# Patient Record
Sex: Female | Born: 1980 | Race: Black or African American | Hispanic: No | Marital: Single | Smoking: Never smoker
Health system: Southern US, Community
[De-identification: ages and names within clinical notes are randomized; demographics above are authoritative.]

## PROBLEM LIST (undated history)

## (undated) DIAGNOSIS — J45909 Unspecified asthma, uncomplicated: Secondary | ICD-10-CM

## (undated) DIAGNOSIS — J302 Other seasonal allergic rhinitis: Secondary | ICD-10-CM

## (undated) DIAGNOSIS — I509 Heart failure, unspecified: Secondary | ICD-10-CM

## (undated) DIAGNOSIS — D649 Anemia, unspecified: Secondary | ICD-10-CM

## (undated) DIAGNOSIS — I10 Essential (primary) hypertension: Secondary | ICD-10-CM

## (undated) HISTORY — PX: ADENOIDECTOMY: SHX5191

## (undated) HISTORY — PX: TONSILLECTOMY: SUR1361

---

## 2014-11-10 ENCOUNTER — Encounter (HOSPITAL_BASED_OUTPATIENT_CLINIC_OR_DEPARTMENT_OTHER): Payer: Self-pay | Admitting: *Deleted

## 2014-11-10 ENCOUNTER — Emergency Department (HOSPITAL_BASED_OUTPATIENT_CLINIC_OR_DEPARTMENT_OTHER): Payer: No Typology Code available for payment source

## 2014-11-10 ENCOUNTER — Emergency Department (HOSPITAL_BASED_OUTPATIENT_CLINIC_OR_DEPARTMENT_OTHER)
Admission: EM | Admit: 2014-11-10 | Discharge: 2014-11-10 | Disposition: A | Discharge status: Home / Self Care | Payer: No Typology Code available for payment source | Attending: Emergency Medicine | Admitting: Emergency Medicine

## 2014-11-10 DIAGNOSIS — Z862 Personal history of diseases of the blood and blood-forming organs and certain disorders involving the immune mechanism: Secondary | ICD-10-CM | POA: Insufficient documentation

## 2014-11-10 DIAGNOSIS — I509 Heart failure, unspecified: Secondary | ICD-10-CM | POA: Insufficient documentation

## 2014-11-10 DIAGNOSIS — Y9389 Activity, other specified: Secondary | ICD-10-CM | POA: Insufficient documentation

## 2014-11-10 DIAGNOSIS — I10 Essential (primary) hypertension: Secondary | ICD-10-CM | POA: Insufficient documentation

## 2014-11-10 DIAGNOSIS — S8001XA Contusion of right knee, initial encounter: Secondary | ICD-10-CM

## 2014-11-10 DIAGNOSIS — Y998 Other external cause status: Secondary | ICD-10-CM | POA: Insufficient documentation

## 2014-11-10 DIAGNOSIS — J45909 Unspecified asthma, uncomplicated: Secondary | ICD-10-CM | POA: Insufficient documentation

## 2014-11-10 DIAGNOSIS — Z79899 Other long term (current) drug therapy: Secondary | ICD-10-CM | POA: Insufficient documentation

## 2014-11-10 DIAGNOSIS — Y92410 Unspecified street and highway as the place of occurrence of the external cause: Secondary | ICD-10-CM | POA: Insufficient documentation

## 2014-11-10 HISTORY — DX: Heart failure, unspecified: I50.9

## 2014-11-10 HISTORY — DX: Unspecified asthma, uncomplicated: J45.909

## 2014-11-10 HISTORY — DX: Morbid (severe) obesity due to excess calories: E66.01

## 2014-11-10 HISTORY — DX: Other seasonal allergic rhinitis: J30.2

## 2014-11-10 HISTORY — DX: Essential (primary) hypertension: I10

## 2014-11-10 HISTORY — DX: Anemia, unspecified: D64.9

## 2014-11-10 MED ORDER — TRAMADOL HCL 50 MG PO TABS: 50.0000 mg | ORAL_TABLET | Freq: Four times a day (QID) | ORAL | Status: AC | PRN

## 2014-11-10 NOTE — ED Notes (Signed)
Family member to bedside
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
## 2014-11-10 NOTE — Discharge Instructions (Signed)
Contusion °A contusion is a deep bruise. Contusions are the result of an injury that caused bleeding under the skin. The contusion may turn blue, purple, or yellow. Minor injuries will give you a painless contusion, but more severe contusions may stay painful and swollen for a few weeks.  °CAUSES  °A contusion is usually caused by a blow, trauma, or direct force to an area of the body. °SYMPTOMS  °· Swelling and redness of the injured area. °· Bruising of the injured area. °· Tenderness and soreness of the injured area. °· Pain. °DIAGNOSIS  °The diagnosis can be made by taking a history and physical exam. An X-ray, CT scan, or MRI may be needed to determine if there were any associated injuries, such as fractures. °TREATMENT  °Specific treatment will depend on what area of the body was injured. In general, the best treatment for a contusion is resting, icing, elevating, and applying cold compresses to the injured area. Over-the-counter medicines may also be recommended for pain control. Ask your caregiver what the best treatment is for your contusion. °HOME CARE INSTRUCTIONS  °· Put ice on the injured area. °· Put ice in a plastic bag. °· Place a towel between your skin and the bag. °· Leave the ice on for 15-20 minutes, 3-4 times a day, or as directed by your health care provider. °· Only take over-the-counter or prescription medicines for pain, discomfort, or fever as directed by your caregiver. Your caregiver may recommend avoiding anti-inflammatory medicines (aspirin, ibuprofen, and naproxen) for 48 hours because these medicines may increase bruising. °· Rest the injured area. °· If possible, elevate the injured area to reduce swelling. °SEEK IMMEDIATE MEDICAL CARE IF:  °· You have increased bruising or swelling. °· You have pain that is getting worse. °· Your swelling or pain is not relieved with medicines. °MAKE SURE YOU:  °· Understand these instructions. °· Will watch your condition. °· Will get help right  away if you are not doing well or get worse. °Document Released: 08/28/2005 Document Revised: 11/23/2013 Document Reviewed: 09/23/2011 °ExitCare® Patient Information ©2015 [employer]. This information is not intended to replace advice given to you by your health care provider. Make sure you discuss any questions you have with your health care provider. °Motor Vehicle Collision °It is common to have multiple bruises and sore muscles after a motor vehicle collision (MVC). These tend to feel worse for the first 24 hours. You may have the most stiffness and soreness over the first several hours. You may also feel worse when you wake up the first morning after your collision. After this point, you will usually begin to improve with each day. The speed of improvement often depends on the severity of the collision, the number of injuries, and the location and nature of these injuries. °HOME CARE INSTRUCTIONS °· Put ice on the injured area. °¨ Put ice in a plastic bag. °¨ Place a towel between your skin and the bag. °¨ Leave the ice on for 15-20 minutes, 3-4 times a day, or as directed by your health care provider. °· Drink enough fluids to keep your urine clear or pale yellow. Do not drink alcohol. °· Take a warm shower or bath once or twice a day. This will increase blood flow to sore muscles. °· You may return to activities as directed by your caregiver. Be careful when lifting, as this may aggravate neck or back pain. °· Only take over-the-counter or prescription medicines for pain, discomfort, or fever as   directed by your caregiver. Do not use aspirin. This may increase bruising and bleeding. °SEEK IMMEDIATE MEDICAL CARE IF: °· You have numbness, tingling, or weakness in the arms or legs. °· You develop severe headaches not relieved with medicine. °· You have severe neck pain, especially tenderness in the middle of the back of your neck. °· You have changes in bowel or bladder control. °· There is increasing pain in  any area of the body. °· You have shortness of breath, light-headedness, dizziness, or fainting. °· You have chest pain. °· You feel sick to your stomach (nauseous), throw up (vomit), or sweat. °· You have increasing abdominal discomfort. °· There is blood in your urine, stool, or vomit. °· You have pain in your shoulder (shoulder strap areas). °· You feel your symptoms are getting worse. °MAKE SURE YOU: °· Understand these instructions. °· Will watch your condition. °· Will get help right away if you are not doing well or get worse. °Document Released: 11/18/2005 Document Revised: 04/04/2014 Document Reviewed: 04/17/2011 °ExitCare® Patient Information ©2015 [employer]. This information is not intended to replace advice given to you by your health care provider. Make sure you discuss any questions you have with your health care provider. ° °Emergency Department Resource Guide °1) Find a Doctor and Pay Out of Pocket °Although you won't have to find out who is covered by your insurance plan, it is a good idea to ask around and get recommendations. You will then need to call the office and see if the doctor you have chosen will accept you as a new patient and what types of options they offer for patients who are self-pay. Some doctors offer discounts or will set up payment plans for their patients who do not have insurance, but you will need to ask so you aren't surprised when you get to your appointment. ° °2) Contact Your Local Health Department °Not all health departments have doctors that can see patients for sick visits, but many do, so it is worth a call to see if yours does. If you don't know where your local health department is, you can check in your phone book. The CDC also has a tool to help you locate your state's health department, and many state websites also have listings of all of their local health departments. ° °3) Find a Walk-in Clinic °If your illness is not likely to be very severe or  complicated, you may want to try a walk in clinic. These are popping up all over the country in pharmacies, drugstores, and shopping centers. They're usually staffed by nurse practitioners or physician assistants that have been trained to treat common illnesses and complaints. They're usually fairly quick and inexpensive. However, if you have serious medical issues or chronic medical problems, these are probably not your best option. ° °No Primary Care Doctor: °- Call [facility] at  832-8000 - they can help you locate a primary care doctor that  accepts your insurance, provides certain services, etc. °- Physician Referral Service- 1-800-533-3463 ° °Chronic Pain Problems: °Organization         Address  Phone   Notes  °[facility]  (336) 297-2271 Patients need to be referred by their primary care doctor.  ° °Medication Assistance: °Organization         Address  Phone   Notes  °[place] [facility] [address] °[place], [place] [address] (336) 641-8030 --Must be a resident of [place] °--   Must have NO insurance coverage whatsoever (no Medicaid/ Medicare, etc.) °-- The pt. MUST have a primary care doctor that directs their care regularly and follows them in the community °  °[facility]  (866) 331-1348   °[employer]  (888) 892-1162   ° °Agencies that provide inexpensive medical care: °Organization         Address  Phone   Notes  °[facility]  (336) 832-8035   °[facility]    (336) 832-7272   °[facility] [address] °[place], [place] [address] (336) 832-4777   °[facility] [address], °[place] (336) 271-4999   °[facility]    (336) 373-0678   °[facility]    (336) 272-1050   °[facility] ° [address], [place] Phone:  (336) 832-4444, Fax:  (336) 832-4440 Hours of Operation:  9 am - 6 pm, M-F.  Also accepts  Medicaid/Medicare and self-pay.  °[facility] ° [address], [place] Phone: (336) 832-3150, Fax: (336) [phone]. Hours of Operation:  8:30 am - 5:30 pm, M-F.  Also accepts Medicaid and self-pay.  °[facility] [place] [address], [place] Phone: (336) 878-6027   °[facility] [address], [place], [place] (336)723-1848, Ext. 123 Mondays & Thursdays: 7-9 AM.  First 15 patients are seen on a first come, first serve basis. °  ° °Medicaid-accepting [place] Providers: ° °Organization         Address  Phone   Notes  °[facility] [address], [place] (336) 641-2100 Also accepts self-pay patients.  °[facility] [address], [place] ° (336) 856-9996   °[facility] [address], [place] (336) 288-8857   °[facility] [address] [place] [address], [place] (336) 299-7000   °Veita Bland [address], [place]  ° (336) 373-1557 Only accepts [place] Access Medicaid patients after they have their name applied to their card.  ° °Self-Pay (no insurance) in [place]: ° °Organization         Address  Phone   Notes  °Sickle Cell Patients, [facility] [address], [place] (336) 832-1970   °[facility] [address], [place] (336) 832-4400   °[facility] ° [address], [place] (336) 992-4800   °[facility]/Dr. Osei-Bonsu ° [address] [place] [address], [place] or [address], [place] (336) [phone] Phone number for both [place] and [place] locations is the same.  °[facility] [address], [place] (336) 299-0000   °[facility] [address] [place] [address], [place] or [address] (336) 852-7530 °(336) 878-2260   °[facility] [address],  (336)  350-1642, phone; (336) 294-5005, fax Sees patients 1st and 3rd Saturday of every month.  Must not qualify for public or private insurance (i.e. Medicaid, Medicare,  Health Choice, Veterans' Benefits) • Household income should be no more than 200% of the poverty level •The clinic cannot treat you if you are pregnant or think you are pregnant • Sexually transmitted diseases are not treated at the clinic.  ° ° °Dental Care: °Organization         Address  Phone  Notes  °  [place] [facility] [address], [place] (336) 641-6152 Accepts children up to age 21 who are enrolled in Medicaid or [place] Health Choice; pregnant women with a Medicaid card; and children who have applied for Medicaid or [place] Health Choice, but were declined, whose parents can pay a reduced fee at time of service.  °[facility] [place]  [address], [place] (336) 641-7733 Accepts children up to age 21 who are enrolled in Medicaid or [place] Health Choice; pregnant women with a Medicaid card; and children who have applied for Medicaid or [place] Health Choice, but were declined, whose parents can pay a reduced fee at time of service.  °[facility] ° [address], [place] (336) 641-4533 Patients are seen by appointment only. Walk-ins are not accepted. [facility] will see patients 18 years of age and older. °Monday - Tuesday (8am-5pm) °Most Wednesdays (8:30-5pm) °$30 per visit, cash only  °[facility] ° [address], [place] (336) 641-4533 Patients are seen by appointment only. Walk-ins are not accepted. [facility] will see patients 18 years of age and older. °One Wednesday Evening (Monthly: Volunteer Based).  $30 per visit, cash only  °[facility]  (919) 537-3737 for adults; Children under age 4, call Graduate Pediatric Dentistry at ([phone]) [phone]. Children aged  4-14, please call (919) [phone] to request a pediatric application. ° Dental services are provided in all areas of dental care including fillings, crowns and bridges, complete and partial dentures, implants, gum treatment, root canals, and extractions. Preventive care is also provided. Treatment is provided to both adults and children. °Patients are selected via a lottery and there is often a waiting list. °  °[facility] [address], °[place] ° (336) 763-8833 [email or web address] °  °[facility] [address], [place], [place] (336)723-1848, Ext. 123 Second and Fourth Thursday of each month, opens at 6:30 AM; Clinic ends at 9 AM.  Patients are seen on a first-come first-served basis, and a limited number are seen during each clinic.  ° °[facility] ° [address], [place], [place] (336) 723-7904   Eligibility Requirements °You must have lived in [place], [place], or [place] counties for at least the last three months. °  You cannot be eligible for state or federal sponsored healthcare insurance, including Veterans Administration, Medicaid, or Medicare. °  You generally cannot be eligible for healthcare insurance through your employer.  °  How to apply: °Eligibility screenings are held every Tuesday and Wednesday afternoon from 1:00 pm until 4:00 pm. You do not need an appointment for the interview!  °[facility] [address], [place], [place] 336-631-2330   °[facility]  336-342-8273   °[facility]  336-703-3100   °[facility]  336-570-6415   ° °Behavioral Health Resources in the Community: °Intensive Outpatient Programs °Organization         Address  Phone  Notes  °[facility] [address], [place], [place] 336-878-6098   °[facility] [address], [place], [place] 336-832-9800   °[facility] [address],  [place], [place] ° 336-882-2125   °[facility] [address],  °[place], [place] 1-800-853-5163 or 336-641-4981   °Substance Abuse Resources °Organization           Address  Phone  Notes  °[facility]  336-882-2125   °[facility]  336-784-9470   °[facility]  336-285-9073   °[facility]  336-845-3988   °Residential & Outpatient Substance Abuse Program  1-800-659-3381   °Psychological Services °Organization         Address  Phone  Notes  °[facility]  336- 832-9600   °[facility]  336- 378-7881   °[facility] [address], [place] 1-800-853-5163 or 336-641-4981   ° °Mobile Crisis Teams °Organization         Address  Phone  Notes  °[facility], Mobile Crisis Care Unit  1-877-626-1772   °Assertive °[facility] ° [address] [place], [place] 336-834-9664   °Sharon DeEsch [address] °[place] 336-554-5454   ° °Self-Help/Support Groups °Organization         Address  Phone             Notes  °[facility] [place] - variety of support groups  336- 373-1402 Call for more information  °Narcotics Anonymous (NA), [facility] [address], °[place]  2 meetings at this location  ° °Residential Treatment Programs °Organization         Address  Phone  Notes  °[facility] [address],    °[place]  1-866-801-8205   °[facility] ° [address], [place], [place] 704-293-8524   °[facility] [address], [place] 336-845-3988 Admissions: 8am-3pm M-F  °[facility] [address]    °[place], [place] 336-841-1104   °[facility] [address], [place], [place] 336-379-7146   °[facility] [address]  °[place], [place] 336-285-9073   °[facility] Intensive Outpatient [address], [place], [place] 336-852-3033   °[facility]  ([facility] [address]  °[place], [place] 1-877-615-2722 or 336-784-9470   °[facility] [address] [place], [place] 336-227-7417 Accepts Medicaid  °[facility] [address]  °[place] 1-800-659-3381 Substance Abuse/Addiction Treatment  ° °[place] Behavioral Health Resources °Organization         Address  Phone  Notes  °[facility]  (888) 581-9988   °Julie Brannon, PhD [address] [place], [place]   (336) 349-5553 or (336) 951-0000   °[facility]   [address] °[place], [place] (336) 349-4454   °[facility] [address], [place], [place] (336) 342-8316 Insurance/Medicaid/sponsorship through [facility]  °[facility] [address]                                    [place], [place] (336) 342-8316 Therapy/tele-psych/case  °[facility] [address]  ° [place],  (336) 349-2233    °Dr. Arfeen  (336) 349-4544   °[facility] [place]  [employer] [place] [facility] 1) [address],  °2) [address], [place] °3)  [address], [place] (336) 349-3220 °(336) 342-7768 ° °(336) 342-8140   °[place] [facility] (336) 342-1394 or (336) 342-3537 (After Hours)    ° ° °

## 2014-11-10 NOTE — ED Notes (Signed)
Patient transported to X-ray via wheelchair per tech. 
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
## 2015-06-15 ENCOUNTER — Emergency Department (HOSPITAL_BASED_OUTPATIENT_CLINIC_OR_DEPARTMENT_OTHER)
Admission: EM | Admit: 2015-06-15 | Discharge: 2015-06-15 | Disposition: A | Discharge status: Home / Self Care | Payer: BLUE CROSS/BLUE SHIELD | Attending: Emergency Medicine | Admitting: Emergency Medicine

## 2015-06-15 ENCOUNTER — Emergency Department (HOSPITAL_BASED_OUTPATIENT_CLINIC_OR_DEPARTMENT_OTHER): Payer: BLUE CROSS/BLUE SHIELD

## 2015-06-15 ENCOUNTER — Encounter (HOSPITAL_BASED_OUTPATIENT_CLINIC_OR_DEPARTMENT_OTHER): Payer: Self-pay | Admitting: *Deleted

## 2015-06-15 DIAGNOSIS — I509 Heart failure, unspecified: Secondary | ICD-10-CM | POA: Insufficient documentation

## 2015-06-15 DIAGNOSIS — M7989 Other specified soft tissue disorders: Secondary | ICD-10-CM | POA: Diagnosis not present

## 2015-06-15 DIAGNOSIS — Z79899 Other long term (current) drug therapy: Secondary | ICD-10-CM | POA: Diagnosis not present

## 2015-06-15 DIAGNOSIS — J45909 Unspecified asthma, uncomplicated: Secondary | ICD-10-CM | POA: Diagnosis not present

## 2015-06-15 DIAGNOSIS — Z862 Personal history of diseases of the blood and blood-forming organs and certain disorders involving the immune mechanism: Secondary | ICD-10-CM | POA: Diagnosis not present

## 2015-06-15 DIAGNOSIS — Z7951 Long term (current) use of inhaled steroids: Secondary | ICD-10-CM | POA: Insufficient documentation

## 2015-06-15 DIAGNOSIS — N390 Urinary tract infection, site not specified: Secondary | ICD-10-CM | POA: Insufficient documentation

## 2015-06-15 DIAGNOSIS — R109 Unspecified abdominal pain: Secondary | ICD-10-CM | POA: Diagnosis present

## 2015-06-15 DIAGNOSIS — I10 Essential (primary) hypertension: Secondary | ICD-10-CM | POA: Insufficient documentation

## 2015-06-15 LAB — URINALYSIS, ROUTINE W REFLEX MICROSCOPIC
BILIRUBIN URINE: NEGATIVE
Glucose, UA: NEGATIVE mg/dL
Hgb urine dipstick: NEGATIVE
KETONES UR: NEGATIVE mg/dL
Nitrite: NEGATIVE
Protein, ur: 30 mg/dL — AB
Specific Gravity, Urine: 1.016 (ref 1.005–1.030)
UROBILINOGEN UA: 1 mg/dL (ref 0.0–1.0)
pH: 6.5 (ref 5.0–8.0)

## 2015-06-15 LAB — CBC WITH DIFFERENTIAL/PLATELET
Basophils Absolute: 0 10*3/uL (ref 0.0–0.1)
Basophils Relative: 0 % (ref 0–1)
EOS ABS: 0.1 10*3/uL (ref 0.0–0.7)
Eosinophils Relative: 2 % (ref 0–5)
HEMATOCRIT: 37.7 % (ref 36.0–46.0)
Hemoglobin: 11.8 g/dL — ABNORMAL LOW (ref 12.0–15.0)
Lymphocytes Relative: 31 % (ref 12–46)
Lymphs Abs: 1.3 10*3/uL (ref 0.7–4.0)
MCH: 24.9 pg — AB (ref 26.0–34.0)
MCHC: 31.3 g/dL (ref 30.0–36.0)
MCV: 79.7 fL (ref 78.0–100.0)
MONO ABS: 0.4 10*3/uL (ref 0.1–1.0)
MONOS PCT: 9 % (ref 3–12)
NEUTROS PCT: 58 % (ref 43–77)
Neutro Abs: 2.4 10*3/uL (ref 1.7–7.7)
Platelets: 203 10*3/uL (ref 150–400)
RBC: 4.73 MIL/uL (ref 3.87–5.11)
RDW: 16.2 % — ABNORMAL HIGH (ref 11.5–15.5)
WBC: 4.3 10*3/uL (ref 4.0–10.5)

## 2015-06-15 LAB — BASIC METABOLIC PANEL
Anion gap: 9 (ref 5–15)
BUN: 16 mg/dL (ref 6–20)
CALCIUM: 9.2 mg/dL (ref 8.9–10.3)
CO2: 27 mmol/L (ref 22–32)
Chloride: 102 mmol/L (ref 101–111)
Creatinine, Ser: 1.23 mg/dL — ABNORMAL HIGH (ref 0.44–1.00)
GFR calc Af Amer: >60 mL/min (ref >60)
GFR, EST NON AFRICAN AMERICAN: 56 mL/min — AB (ref >60)
GLUCOSE: 93 mg/dL (ref 65–99)
POTASSIUM: 3.1 mmol/L — AB (ref 3.5–5.1)
Sodium: 138 mmol/L (ref 135–145)

## 2015-06-15 LAB — URINE MICROSCOPIC-ADD ON

## 2015-06-15 LAB — PREGNANCY, URINE: PREG TEST UR: NEGATIVE

## 2015-06-15 MED ORDER — CEPHALEXIN 500 MG PO CAPS: 500.0000 mg | ORAL_CAPSULE | Freq: Four times a day (QID) | ORAL | Status: AC

## 2015-06-15 MED ORDER — CEPHALEXIN 250 MG PO CAPS
500.0000 mg | ORAL_CAPSULE | Freq: Once | ORAL | Status: AC
  Administered 2015-06-15: 500 mg via ORAL
  Filled 2015-06-15: qty 2

## 2015-06-15 NOTE — ED Notes (Signed)
Pt reports increased frequency and urgency with urination, strong odor to urine, reports increased pain with lower back and when urinates

## 2015-06-15 NOTE — ED Provider Notes (Signed)
CSN: [id number]     Arrival date & time 06/15/15  1849 History  This chart was scribed for Vanetta Mulders, MD by Elon Spanner, ED Scribe. This patient was seen in room MH11/MH11 and the patient's care was started at 8:44 PM.   Chief Complaint  Patient presents with  . Abdominal Pain   The history is provided by the patient. No language interpreter was used.   HPI Comments: Holly Newman is a 34 y.o. female who presents to the Emergency Department complaining of constant LLQ abdominal pain onset 3 days ago.  The pain is present only with the sensation of urgency.  If she does not relieve herself, she becomes incontinent.  She also reports associated fever TMAX 101-102 today and non-bloddy diarrhea once 3 days ago.  The patient reports a history of similar pain diagnosed as a UTI.  There is no history of kidney stones.  She denies n/v.    Past Medical History  Diagnosis Date  . Hypertension   . CHF (congestive heart failure)   . Asthma   . Anemia   . Seasonal allergies   . Obesities, morbid    Past Surgical History  Procedure Laterality Date  . Tonsillectomy    . Adenoidectomy     No family history on file. History  Substance Use Topics  . Smoking status: Never Smoker   . Smokeless tobacco: Never Used  . Alcohol Use: No   OB History    No data available     Review of Systems  Constitutional: Positive for fever. Negative for chills.  HENT: Negative for rhinorrhea and sore throat.   Eyes: Negative for visual disturbance.  Respiratory: Negative for cough and shortness of breath.   Cardiovascular: Positive for leg swelling. Negative for chest pain.  Gastrointestinal: Positive for abdominal pain.  Genitourinary: Positive for dysuria and urgency.  Musculoskeletal: Positive for back pain. Negative for neck pain.  Skin: Negative for rash.  Neurological: Negative for headaches.  Hematological: Does not bruise/bleed easily.  Psychiatric/Behavioral: Negative for confusion.       Allergies  Review of patient's allergies indicates no known allergies.  Home Medications   Prior to Admission medications   Medication Sig Start Date End Date Taking? Authorizing Provider  albuterol (PROVENTIL HFA;VENTOLIN HFA) 108 (90 BASE) MCG/ACT inhaler Inhale into the lungs every 6 (six) hours as needed for wheezing or shortness of breath.    Historical Provider, MD  budesonide-formoterol (SYMBICORT) 160-4.5 MCG/ACT inhaler Inhale 2 puffs into the lungs 2 (two) times daily.    Historical Provider, MD  carvedilol (COREG) 12.5 MG tablet Take 12.5 mg by mouth 2 (two) times daily with a meal.    Historical Provider, MD  cephALEXin (KEFLEX) 500 MG capsule Take 1 capsule (500 mg total) by mouth 4 (four) times daily. 06/15/15   Vanetta Mulders, MD  cloNIDine (CATAPRES) 0.2 MG tablet Take 0.2 mg by mouth 3 (three) times daily.    Historical Provider, MD  furosemide (LASIX) 40 MG tablet Take 40 mg by mouth 2 (two) times daily.    Historical Provider, MD  hydrALAZINE (APRESOLINE) 25 MG tablet Take 75 mg by mouth 4 (four) times daily.    Historical Provider, MD  isosorbide mononitrate (IMDUR) 60 MG 24 hr tablet Take 60 mg by mouth daily.    Historical Provider, MD  potassium chloride SA (K-DUR,KLOR-CON) 20 MEQ tablet Take 20 mEq by mouth 2 (two) times daily.    Historical Provider, MD  traMADol (ULTRAM) 50 MG  tablet Take 1 tablet (50 mg total) by mouth every 6 (six) hours as needed. 11/10/14   Arby BarretteMarcy Pfeiffer, MD   BP 183/102 mmHg  Pulse 61  Temp(Src) 98.5 F (36.9 C) (Oral)  Resp 18  Ht 5\' 6"  (1.676 m)  Wt 448 lb 8 oz (203.438 kg)  BMI 72.42 kg/m2  SpO2 100%  LMP 05/08/2015 Physical Exam  Constitutional: She is oriented to person, place, and time. She appears well-developed and well-nourished. No distress.  HENT:  Head: Normocephalic and atraumatic.  Mouth/Throat: Oropharynx is clear and moist.  Eyes: Conjunctivae and EOM are normal.  Pupils normal.  Eyes track normal.  Sclera  clear.   Neck: Neck supple. No tracheal deviation present.  Cardiovascular: Normal rate, regular rhythm and normal heart sounds.   Pulmonary/Chest: Effort normal and breath sounds normal. No respiratory distress.  Lungs CTA bilaterally.   Abdominal: Bowel sounds are normal. There is no tenderness.  Genitourinary:  Nontender in the left CVA area.   Musculoskeletal: Normal range of motion. She exhibits no edema.  Neurological: She is alert and oriented to person, place, and time. No cranial nerve deficit. She exhibits normal muscle tone. Coordination normal.  Skin: Skin is warm and dry.  Psychiatric: She has a normal mood and affect. Her behavior is normal.  Nursing note and vitals reviewed.   ED Course  Procedures (including critical care time)  DIAGNOSTIC STUDIES: Oxygen Saturation is 100% on RA, normal by my interpretation.    COORDINATION OF CARE:  8:48 PM Discussed treatment plan with patient at bedside.  Patient acknowledges and agrees with plan.    Labs Review Labs Reviewed  URINALYSIS, ROUTINE W REFLEX MICROSCOPIC (NOT AT [facility]) - Abnormal; Notable for the following:    APPearance CLOUDY (*)    Protein, ur 30 (*)    Leukocytes, UA SMALL (*)    All other components within normal limits  URINE MICROSCOPIC-ADD ON - Abnormal; Notable for the following:    Bacteria, UA MANY (*)    All other components within normal limits  BASIC METABOLIC PANEL - Abnormal; Notable for the following:    Potassium 3.1 (*)    Creatinine, Ser 1.23 (*)    GFR calc non Af Amer 56 (*)    All other components within normal limits  CBC WITH DIFFERENTIAL/PLATELET - Abnormal; Notable for the following:    Hemoglobin 11.8 (*)    MCH 24.9 (*)    RDW 16.2 (*)    All other components within normal limits  URINE CULTURE  PREGNANCY, URINE   Results for orders placed or performed during the hospital encounter of 06/15/15  Urinalysis, Routine w reflex microscopic (not at [facility])  Result Value Ref Range    Color, Urine YELLOW YELLOW   APPearance CLOUDY (A) CLEAR   Specific Gravity, Urine 1.016 1.005 - 1.030   pH 6.5 5.0 - 8.0   Glucose, UA NEGATIVE NEGATIVE mg/dL   Hgb urine dipstick NEGATIVE NEGATIVE   Bilirubin Urine NEGATIVE NEGATIVE   Ketones, ur NEGATIVE NEGATIVE mg/dL   Protein, ur 30 (A) NEGATIVE mg/dL   Urobilinogen, UA 1.0 0.0 - 1.0 mg/dL   Nitrite NEGATIVE NEGATIVE   Leukocytes, UA SMALL (A) NEGATIVE  Urine microscopic-add on  Result Value Ref Range   Squamous Epithelial / LPF RARE RARE   WBC, UA 3-6 <3 WBC/hpf   RBC / HPF 0-2 <3 RBC/hpf   Bacteria, UA MANY (A) RARE  Pregnancy, urine  Result Value Ref Range   Preg  Test, Ur NEGATIVE NEGATIVE  Basic metabolic panel  Result Value Ref Range   Sodium 138 135 - 145 mmol/L   Potassium 3.1 (L) 3.5 - 5.1 mmol/L   Chloride 102 101 - 111 mmol/L   CO2 27 22 - 32 mmol/L   Glucose, Bld 93 65 - 99 mg/dL   BUN 16 6 - 20 mg/dL   Creatinine, Ser 1.61 (H) 0.44 - 1.00 mg/dL   Calcium 9.2 8.9 - 09.6 mg/dL   GFR calc non Af Amer 56 (L) >60 mL/min   GFR calc Af Amer >60 >60 mL/min   Anion gap 9 5 - 15  CBC with Differential/Platelet  Result Value Ref Range   WBC 4.3 4.0 - 10.5 K/uL   RBC 4.73 3.87 - 5.11 MIL/uL   Hemoglobin 11.8 (L) 12.0 - 15.0 g/dL   HCT 04.5 40.9 - 81.1 %   MCV 79.7 78.0 - 100.0 fL   MCH 24.9 (L) 26.0 - 34.0 pg   MCHC 31.3 30.0 - 36.0 g/dL   RDW 91.4 (H) 78.2 - 95.6 %   Platelets 203 150 - 400 K/uL   Neutrophils Relative % 58 43 - 77 %   Neutro Abs 2.4 1.7 - 7.7 K/uL   Lymphocytes Relative 31 12 - 46 %   Lymphs Abs 1.3 0.7 - 4.0 K/uL   Monocytes Relative 9 3 - 12 %   Monocytes Absolute 0.4 0.1 - 1.0 K/uL   Eosinophils Relative 2 0 - 5 %   Eosinophils Absolute 0.1 0.0 - 0.7 K/uL   Basophils Relative 0 0 - 1 %   Basophils Absolute 0.0 0.0 - 0.1 K/uL     Imaging Review Ct Renal Stone Study  06/15/2015   CLINICAL DATA:  34 year old female with left lower quadrant pain  EXAM: CT ABDOMEN AND PELVIS WITHOUT  CONTRAST  TECHNIQUE: Multidetector CT imaging of the abdomen and pelvis was performed following the standard protocol without IV contrast.  COMPARISON:  None.  FINDINGS: Evaluation of this exam is limited in the absence of intravenous contrast. Evaluation is also limited due to streak artifact caused by patient's arms.  The visualized lung bases are clear. No intra-abdominal free air or free fluid.  The liver, gallbladder, pancreas, spleen, adrenal glands, visualized ureters and urinary bladder appear unremarkable.Small focal 1.5 cm right renal upper pole hypodense lesion is not well characterized. Ultrasound or MRI may provide better characterization. There is no hydronephrosis or nephrolithiasis on either side. The uterus is anteverted and grossly unremarkable.  There is moderate stool throughout the colon. No evidence of bowel obstruction. There is scattered sigmoid diverticula without active inflammation. The appendix appears unremarkable.  The visualized abdominal aorta and IVC appear grossly unremarkable. There is no lymphadenopathy. Degenerative changes of the spine. No acute fracture.  IMPRESSION: No hydronephrosis or nephrolithiasis.  Sigmoid diverticulosis. No evidence of bowel obstruction or inflammation.   Electronically Signed   By: Elgie Collard M.D.   On: 06/15/2015 21:33     EKG Interpretation None      MDM   Final diagnoses:  Flank pain  UTI (lower urinary tract infection)    Urinalysis does have a lot of bacteria in the urine but no significant white cells culture sent but patient's symptoms are classic for urinary tract infection for her. Rest of workup negative no evidence of a kidney stone some mild hypokalemia but she has a history of that no leukocytosis no significant anemia kidney function is reasonable slightly elevated creatinine but  BUN is fine we'll go head and treat with Keflex first dose given here will give a seven-day course. Hopefully patient will improve in a  couple days if not spelled backed up by culture.  I personally performed the services described in this documentation, which was scribed in my presence. The recorded information has been reviewed and is accurate.    Vanetta Mulders, MD 06/15/15 [phone]

## 2015-06-15 NOTE — ED Notes (Signed)
MD at bedside. 

## 2015-06-15 NOTE — Discharge Instructions (Signed)
Patient urinalysis with lots of bacteria this may be a urinary tract infection we have sent a culture to follow-up with take the Keflex as directed. Would expect to be better in 2 days. Return for any new or worse symptoms.

## 2015-06-15 NOTE — ED Notes (Signed)
Abdominal pain x 2 days. Thinks she has a UTI.

## 2015-06-17 LAB — URINE CULTURE

## 2015-06-18 ENCOUNTER — Telehealth (HOSPITAL_BASED_OUTPATIENT_CLINIC_OR_DEPARTMENT_OTHER): Payer: Self-pay | Admitting: Emergency Medicine

## 2015-06-18 NOTE — Telephone Encounter (Signed)
Post ED Visit - Positive Culture Follow-up  Culture report reviewed by antimicrobial stewardship pharmacist: []  Wes Dulaney, Pharm.D., BCPS []  Celedonio MiyamotoJeremy Frens, [address] BCPS []  Georgina PillionElizabeth Martin, Pharm.D., BCPS []  MononaMinh Pham, [address] BCPS, AAHIVP []  Estella HuskMichelle Turner, Pharm.D., BCPS, AAHIVP []  Elder CyphersLorie Poole, [address] BCPS Lysle Pearlachel Rumbarger PharmD  Positive urine culture Group B strep Treated with cephalexin, organism sensitive to the same and no further patient follow-up is required at this time.  Berle MullMiller, Viren Lebeau 06/18/2015, 11:32 AM

## 2016-03-10 IMAGING — CT CT RENAL STONE PROTOCOL
1 of 2 series · 15 of 32 positions shown, 19 images · non-contrast
Comparison: None.

CLINICAL DATA: 34-year-old female with left lower quadrant pain

EXAM:
CT ABDOMEN AND PELVIS WITHOUT CONTRAST
TECHNIQUE: Multidetector CT imaging of the abdomen and pelvis was performed
following the standard protocol without IV contrast.

[Series 2: renal stone > 200 lbs 5.0 b31f · axial · 0.98mm/px · z∈[+200,+580]mm · 15 of 84 slices shown, 19 images]
[im 4/84  soft-tissue]
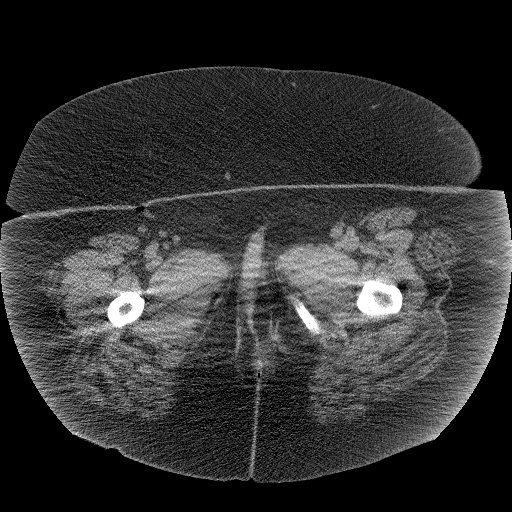
[im 4/84  bone]
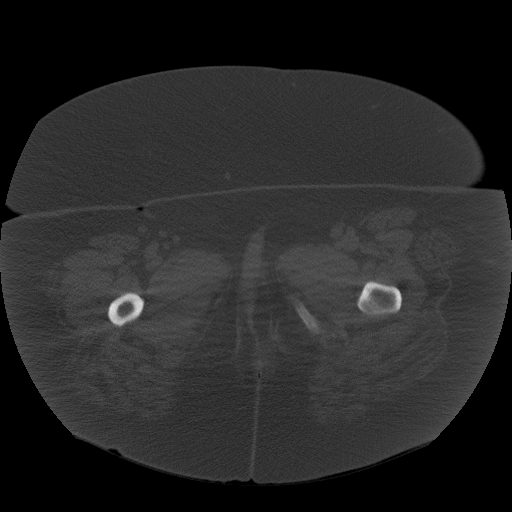
[im 11/84  soft-tissue]
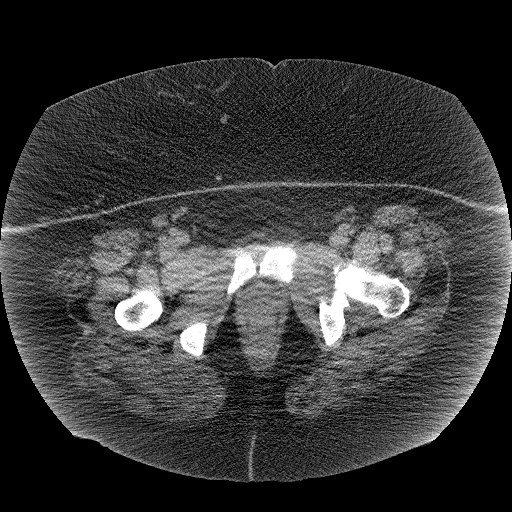
[im 19/84  soft-tissue]
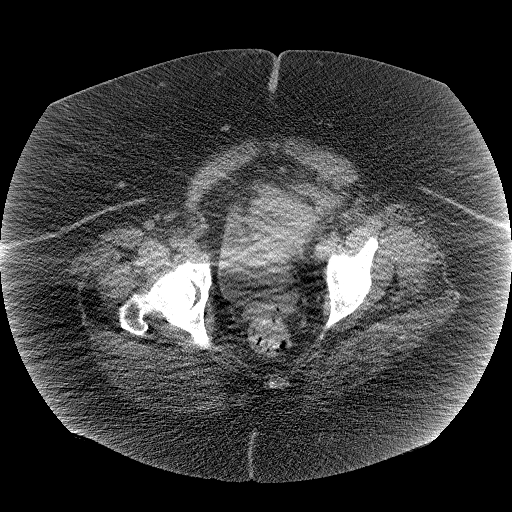
[im 22/84  soft-tissue]
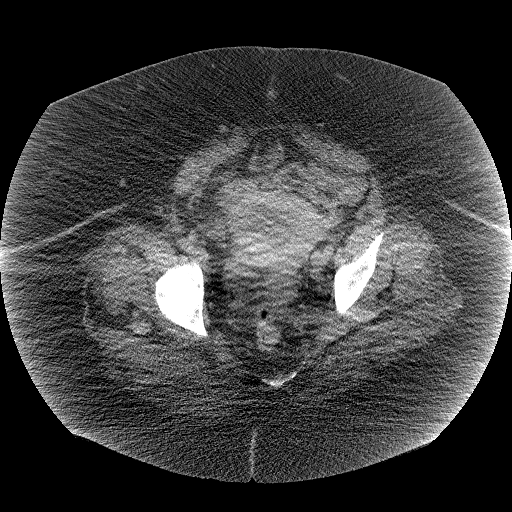
[im 29/84  soft-tissue]
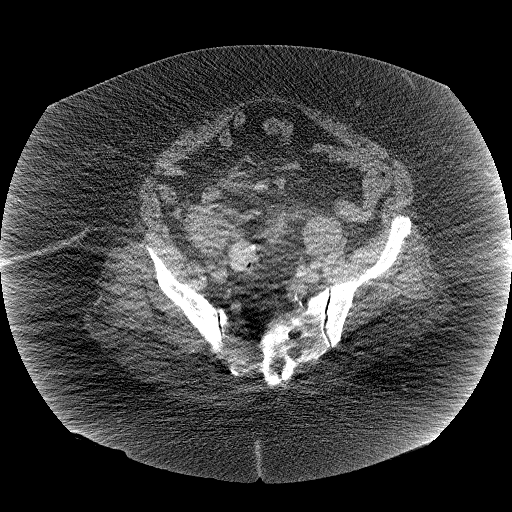
[im 37/84  soft-tissue]
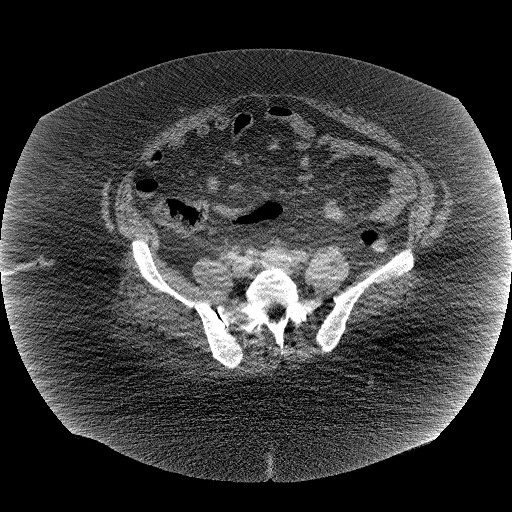
[im 44/84  soft-tissue]
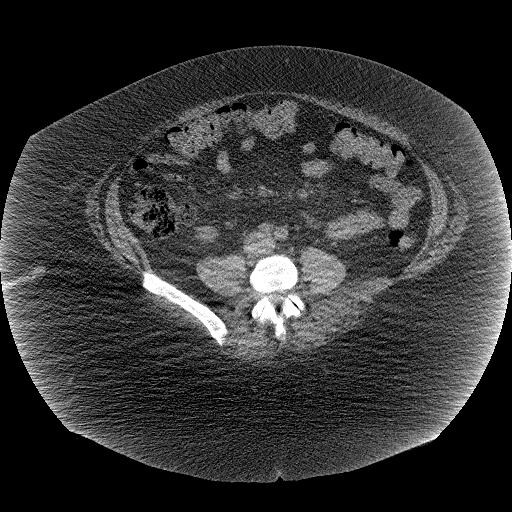
[im 47/84  soft-tissue]
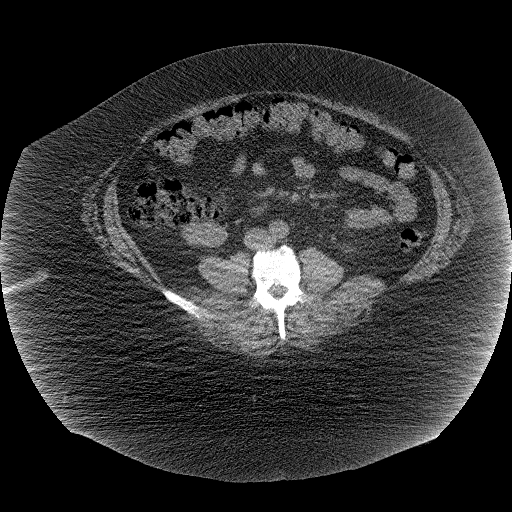
[im 55/84  soft-tissue]
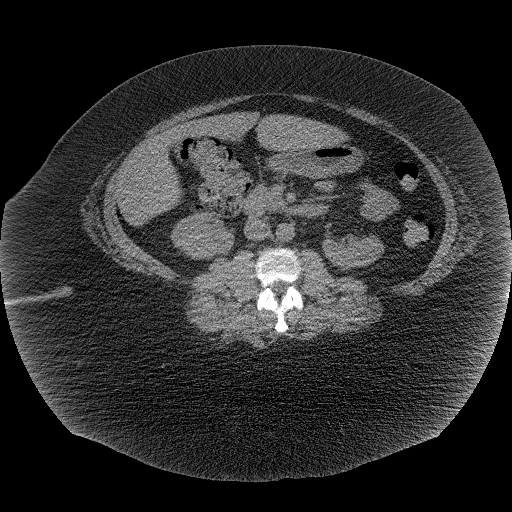
[im 55/84  bone]
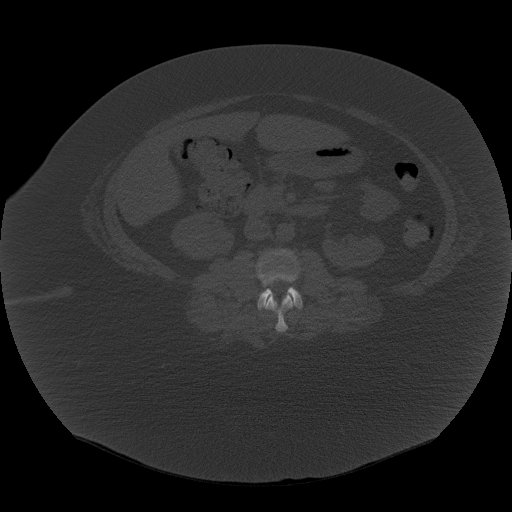
[im 62/84  soft-tissue]
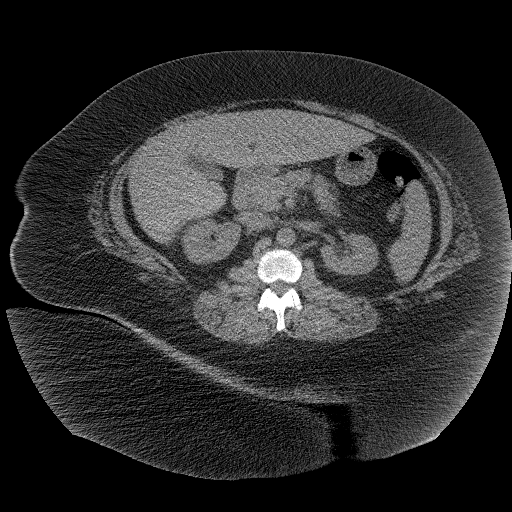
[im 65/84  soft-tissue]
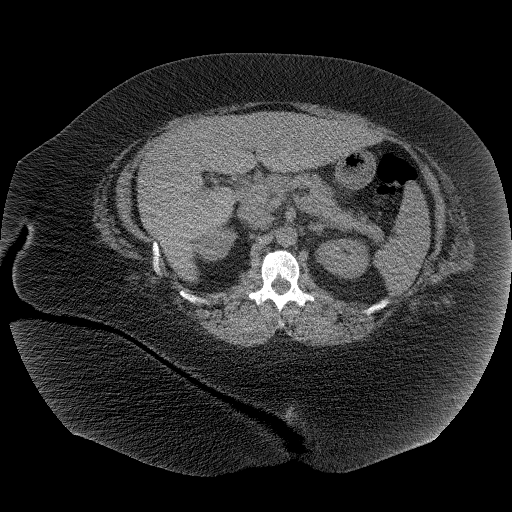
[im 69/84  lung]
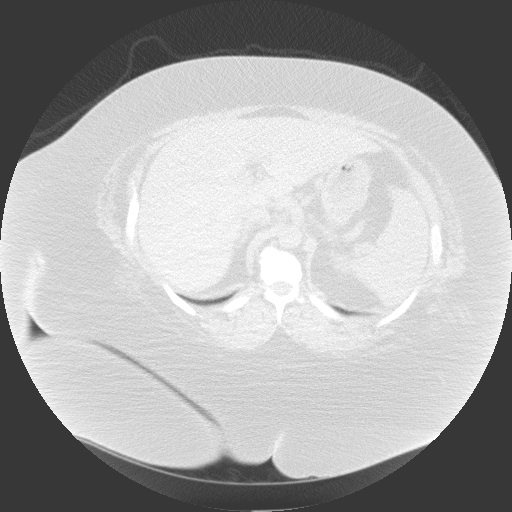
[im 73/84  soft-tissue]
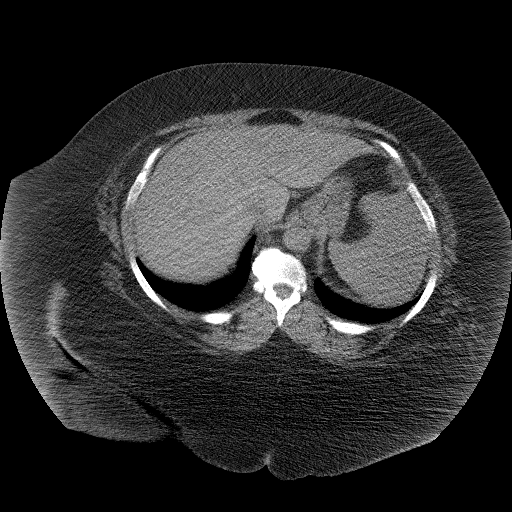
[im 73/84  lung]
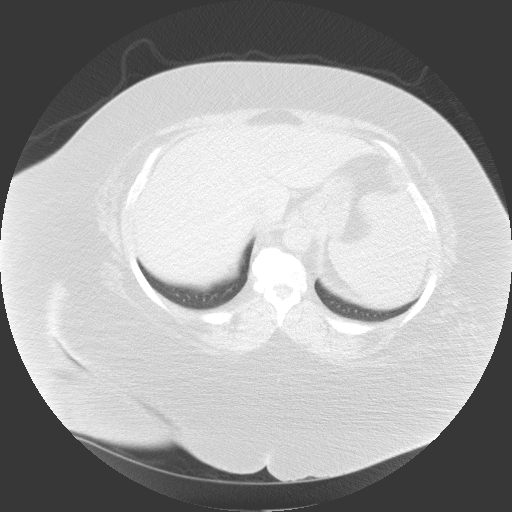
[im 76/84  lung]
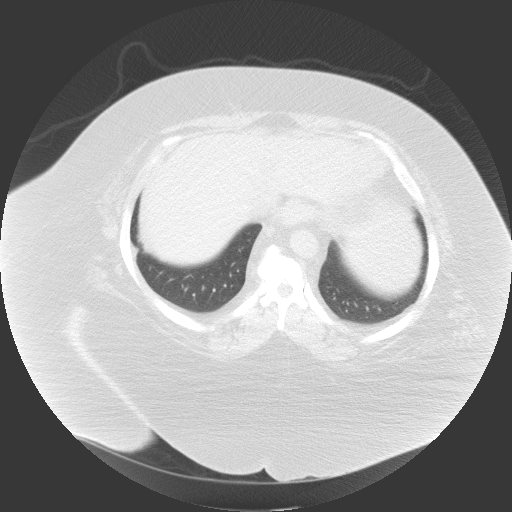
[im 80/84  soft-tissue]
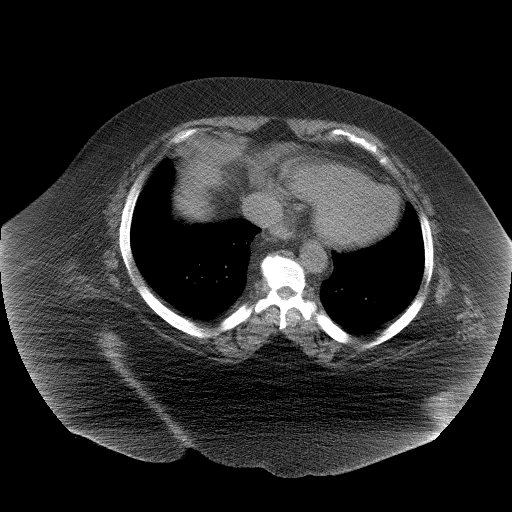
[im 80/84  lung]
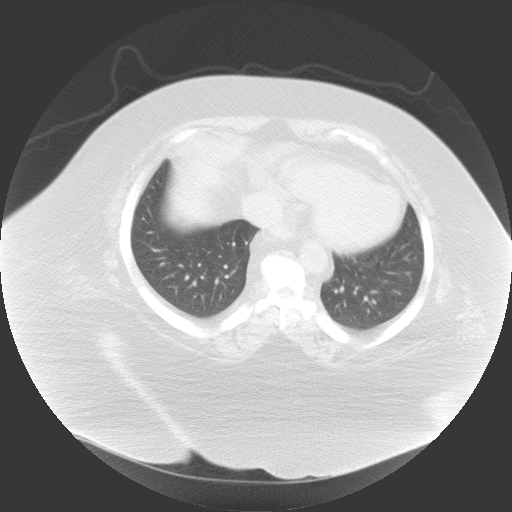

[15 of 32 positions shown; findings below may reference images not displayed]

FINDINGS: Evaluation of this exam is limited in the absence of intravenous
contrast. Evaluation is also limited due to streak artifact caused
by patient's arms.

The visualized lung bases are clear. No intra-abdominal free air or
free fluid.

The liver, gallbladder, pancreas, spleen, adrenal glands, visualized
ureters and urinary bladder appear unremarkable.Small focal 1.5 cm
right renal upper pole hypodense lesion is not well characterized.
Ultrasound or MRI may provide better characterization. There is no
hydronephrosis or nephrolithiasis on either side. The uterus is
anteverted and grossly unremarkable.

There is moderate stool throughout the colon. No evidence of bowel
obstruction. There is scattered sigmoid diverticula without active
inflammation. The appendix appears unremarkable.

The visualized abdominal aorta and IVC appear grossly unremarkable.
There is no lymphadenopathy. Degenerative changes of the spine. No
acute fracture.
IMPRESSION: No hydronephrosis or nephrolithiasis.

Sigmoid diverticulosis. No evidence of bowel obstruction or
inflammation.

## 2016-11-01 DEATH — deceased
